# Patient Record
Sex: Male | Born: 2000 | Race: Asian | Hispanic: No | Marital: Single | Smoking: Never smoker
Health system: Southern US, Community
[De-identification: ages and names within clinical notes are randomized; demographics above are authoritative.]

## PROBLEM LIST (undated history)

## (undated) DIAGNOSIS — L309 Dermatitis, unspecified: Secondary | ICD-10-CM

## (undated) DIAGNOSIS — L509 Urticaria, unspecified: Secondary | ICD-10-CM

## (undated) DIAGNOSIS — F419 Anxiety disorder, unspecified: Secondary | ICD-10-CM

## (undated) DIAGNOSIS — F5082 Avoidant/restrictive food intake disorder: Secondary | ICD-10-CM

## (undated) HISTORY — DX: Urticaria, unspecified: L50.9

---
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
## 2021-01-11 ENCOUNTER — Ambulatory Visit: Payer: 59 | Admitting: Adult Health

## 2021-02-22 ENCOUNTER — Other Ambulatory Visit: Payer: Self-pay | Admitting: Psychiatry

## 2021-02-22 DIAGNOSIS — F5082 Avoidant/restrictive food intake disorder: Secondary | ICD-10-CM

## 2021-02-22 DIAGNOSIS — F410 Panic disorder [episodic paroxysmal anxiety] without agoraphobia: Secondary | ICD-10-CM

## 2021-02-22 DIAGNOSIS — F411 Generalized anxiety disorder: Secondary | ICD-10-CM

## 2021-02-25 ENCOUNTER — Telehealth: Payer: Self-pay | Admitting: Adult Health

## 2021-02-25 ENCOUNTER — Other Ambulatory Visit: Payer: Self-pay | Admitting: Adult Health

## 2021-02-25 DIAGNOSIS — F411 Generalized anxiety disorder: Secondary | ICD-10-CM

## 2021-02-25 DIAGNOSIS — F5082 Avoidant/restrictive food intake disorder: Secondary | ICD-10-CM

## 2021-02-25 DIAGNOSIS — F410 Panic disorder [episodic paroxysmal anxiety] without agoraphobia: Secondary | ICD-10-CM

## 2021-02-25 MED ORDER — MIRTAZAPINE 15 MG PO TBDP: ORAL_TABLET | ORAL | 1 refills | Status: DC

## 2021-02-25 NOTE — Telephone Encounter (Signed)
Next visit is 04/28/21. Requesting refill on Mirtazapine tablets called to [facility], [phone] and phone number is [phone].

## 2021-02-25 NOTE — Telephone Encounter (Signed)
Script sent  

## 2021-02-25 NOTE — Telephone Encounter (Signed)
Please schedule with new provider  Thanks  Lorris Carducci

## 2021-02-25 NOTE — Telephone Encounter (Signed)
LM to call for appt for continue care/medication refills

## 2021-04-28 ENCOUNTER — Ambulatory Visit: Payer: 59 | Admitting: Adult Health

## 2021-05-07 ENCOUNTER — Encounter: Payer: Self-pay | Admitting: Behavioral Health

## 2021-05-07 ENCOUNTER — Other Ambulatory Visit: Payer: Self-pay

## 2021-05-07 ENCOUNTER — Ambulatory Visit (INDEPENDENT_AMBULATORY_CARE_PROVIDER_SITE_OTHER): Payer: 59 | Admitting: Behavioral Health

## 2021-05-07 VITALS — Wt 109.2 lb

## 2021-05-07 DIAGNOSIS — T7840XD Allergy, unspecified, subsequent encounter: Secondary | ICD-10-CM

## 2021-05-07 DIAGNOSIS — F410 Panic disorder [episodic paroxysmal anxiety] without agoraphobia: Secondary | ICD-10-CM | POA: Diagnosis not present

## 2021-05-07 DIAGNOSIS — F5082 Avoidant/restrictive food intake disorder: Secondary | ICD-10-CM

## 2021-05-07 DIAGNOSIS — F411 Generalized anxiety disorder: Secondary | ICD-10-CM

## 2021-05-07 MED ORDER — MIRTAZAPINE 15 MG PO TBDP: ORAL_TABLET | ORAL | 1 refills | Status: DC

## 2021-05-07 MED ORDER — EPINEPHRINE 0.3 MG/0.3ML IJ SOAJ: INTRAMUSCULAR | 0 refills | Status: AC

## 2021-05-07 NOTE — Progress Notes (Signed)
[facility]  Patient ID: Joshua Huffman,  MRN: [id number]  PCP: Maeola Harman, MD  Date of Evaluation: 05/07/2021 Time spent:30 minutes  Chief Complaint:   HISTORY/CURRENT STATUS: HPI  20 year old Asian male presents to this office for medication f/u. He is previous patient of Dr. Marlyne Beards and also [facility]. He says that he is home for college and basically following up for refill on his Remeron. Says that he has noticed some weight gain but not significant. He reports anxiety at 1 and depression at 0 today. Reports sleeping 10 hours per night. He continues with therapy. No mania, SI or HI.   Individual Medical History/ Review of Systems: Changes? :No   Allergies: Almond (diagnostic)  Current Medications:  Current Outpatient Medications:  .  clonazePAM (KLONOPIN) 0.25 MG disintegrating tablet, Take 1 tablet (0.25 mg total) by mouth 2 (two) times daily as needed (anxiety)., Disp: 60 tablet, Rfl: 1 .  EPINEPHrine 0.3 mg/0.3 mL IJ SOAJ injection, INJ UTD, Disp: 1 each, Rfl: 0 .  mirtazapine (REMERON SOL-TAB) 15 MG disintegrating tablet, DISSOLVE 1 TABLET ON THE TONGUE DAILY AT BEDTIME, Disp: 30 tablet, Rfl: 1 .  hydrocortisone 2.5 % cream, Apply topically to affected area(s) twice daily as needed. (Patient not taking: Reported on 05/07/2021), Disp: , Rfl:  .  omeprazole (PRILOSEC) 20 MG capsule, Take 1 capsule (20 mg total) by mouth 2 (two) times daily before a meal. May open capsule and sprinkle beads in applesauce. (Patient not taking: Reported on 05/07/2021), Disp: 60 capsule, Rfl: 1 .  triamcinolone ointment (KENALOG) 0.1 %, Apply topically. (Patient not taking: Reported on 05/07/2021), Disp: , Rfl:    Medication Side Effects: none  Family Medical/ Social History: Changes? no MENTAL HEALTH EXAM:  Weight 109 lb 3.2 oz (49.5 kg).Body mass index is 18.17 kg/m.  General Appearance: Casual, Neat and Well Groomed  Eye Contact:  Good  Speech:  Clear and Coherent   Volume:  Decreased  Mood:  NA  Affect:  Appropriate  Thought Process:  Coherent  Orientation:  Full (Time, Place, and Person)  Thought Content: Logical   Suicidal Thoughts:  No  Homicidal Thoughts:  No  Memory:  WNL  Judgement:  Good  Insight:  Good  Psychomotor Activity:  Normal  Concentration:  Concentration: Good  Recall:  Good  Fund of Knowledge: Good  Language: Good  Assets:  Desire for Improvement  ADL's:  Intact  Cognition: WNL  Prognosis:  Good    DIAGNOSES:    ICD-10-CM   1. Allergic reaction, subsequent encounter  T78.40XD EPINEPHrine 0.3 mg/0.3 mL IJ SOAJ injection  2. Generalized anxiety disorder  F41.1 mirtazapine (REMERON SOL-TAB) 15 MG disintegrating tablet  3. Panic disorder  F41.0 mirtazapine (REMERON SOL-TAB) 15 MG disintegrating tablet  4. 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 Will call if worsening symptoms To follow up in 3 months  To reassess Greater than 50% of face to face time with patient was spent on counseling and coordination of care. We discussed weights and current barriers with food restriction. PHQ2 was negative. Reports weight holding steady. Pt is continuing with psychotherapy. Pt requested refill on Epipen due to his being expired. 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 Has not been able to get back in with prescribing  MD for refill.  Agreed to reassess in August before leaving for school. Discussed sleeping to much at 10 hours plus per night but may be due to effects of Mirtazapine.     Joan Flores, NP

## 2021-08-06 ENCOUNTER — Ambulatory Visit: Payer: 59 | Admitting: Behavioral Health

## 2021-08-10 ENCOUNTER — Ambulatory Visit (INDEPENDENT_AMBULATORY_CARE_PROVIDER_SITE_OTHER): Payer: 59 | Admitting: Behavioral Health

## 2021-08-10 ENCOUNTER — Other Ambulatory Visit: Payer: Self-pay

## 2021-08-10 ENCOUNTER — Encounter: Payer: Self-pay | Admitting: Behavioral Health

## 2021-08-10 DIAGNOSIS — F410 Panic disorder [episodic paroxysmal anxiety] without agoraphobia: Secondary | ICD-10-CM | POA: Diagnosis not present

## 2021-08-10 DIAGNOSIS — F5082 Avoidant/restrictive food intake disorder: Secondary | ICD-10-CM

## 2021-08-10 DIAGNOSIS — F411 Generalized anxiety disorder: Secondary | ICD-10-CM | POA: Diagnosis not present

## 2021-08-10 MED ORDER — MIRTAZAPINE 15 MG PO TBDP: ORAL_TABLET | ORAL | 5 refills | Status: DC

## 2021-08-10 NOTE — Progress Notes (Signed)
[facility]  Patient ID: Joshua Huffman,  MRN: [id number]  PCP: Maeola Harman, MD  Date of Evaluation: 08/10/2021 Time spent:20 minutes  Chief Complaint:  Chief Complaint   Anxiety; Depression; Follow-up; Medication Refill     HISTORY/CURRENT STATUS: HPI 20 year old patient present to this office for follow up and medication management.  Previous patient of Dr. Marlyne Beards and [facility]. He says that he is doing great except for not being able to gain more weight. Says he is not working out like he was before, but will increase when he goes back to college in [place].  Says that he is sleeping 9-10 hours per night. He reports 0 anxiety today and 0 depression. He just wants to continue Mirtazapine for appetite only. He does not need any other medication changes at this time. No mania, no psychosis. No SI/HI.   No prior medication trials noted.   Individual Medical History/ Review of Systems: Changes? :No   Allergies: Almond (diagnostic)  Current Medications:  Current Outpatient Medications:    clonazePAM (KLONOPIN) 0.25 MG disintegrating tablet, Take 1 tablet (0.25 mg total) by mouth 2 (two) times daily as needed (anxiety)., Disp: 60 tablet, Rfl: 1   EPINEPHrine 0.3 mg/0.3 mL IJ SOAJ injection, INJ UTD, Disp: 1 each, Rfl: 0   mirtazapine (REMERON SOL-TAB) 15 MG disintegrating tablet, DISSOLVE 1 TABLET ON THE TONGUE DAILY AT BEDTIME, Disp: 30 tablet, Rfl: 5 Medication Side Effects: none  Family Medical/ Social History: Changes? No  MENTAL HEALTH EXAM:  There were no vitals taken for this visit.There is no height or weight on file to calculate BMI.  General Appearance: Casual, Neat, and Well Groomed  Eye Contact:  Good  Speech:  Clear and Coherent  Volume:  Normal  Mood:  NA  Affect:  Appropriate  Thought Process:  Coherent  Orientation:  Full (Time, Place, and Person)  Thought Content: Logical   Suicidal Thoughts:  No  Homicidal Thoughts:  No  Memory:  WNL   Judgement:  Good  Insight:  Good  Psychomotor Activity:  Normal  Concentration:  Concentration: Good  Recall:  Good  Fund of Knowledge: Good  Language: Good  Assets:  Desire for Improvement  ADL's:  Intact  Cognition: WNL  Prognosis:  Good    DIAGNOSES:    ICD-10-CM   1. Generalized anxiety disorder  F41.1 mirtazapine (REMERON SOL-TAB) 15 MG disintegrating tablet    2. Panic disorder  F41.0 mirtazapine (REMERON SOL-TAB) 15 MG disintegrating tablet    3. 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 Will call if worsening symptoms Provided emergency contact information  To follow up in next June when school is out per patient. Greater than 50% of 30 min face to face time with patient was spent on counseling and coordination of care. We discussed weights and current barriers with food restriction. Discussed increasing nutritional intake with protein shakes. PHQ2 was negative. Reports weight holding steady. Pt is continuing with psychotherapy. He is leaving for school located in [place] end of month.   Joan Flores, NP

## 2022-06-10 ENCOUNTER — Ambulatory Visit: Payer: 59 | Admitting: Behavioral Health

## 2022-09-09 ENCOUNTER — Other Ambulatory Visit: Payer: Self-pay | Admitting: Behavioral Health

## 2022-09-09 DIAGNOSIS — F411 Generalized anxiety disorder: Secondary | ICD-10-CM

## 2022-09-09 DIAGNOSIS — F5082 Avoidant/restrictive food intake disorder: Secondary | ICD-10-CM

## 2022-09-09 DIAGNOSIS — F410 Panic disorder [episodic paroxysmal anxiety] without agoraphobia: Secondary | ICD-10-CM

## 2022-09-12 NOTE — Telephone Encounter (Signed)
It's been over a year since you saw him. 07/2021. Is he still your patient?

## 2023-06-23 ENCOUNTER — Other Ambulatory Visit: Payer: Self-pay | Admitting: Behavioral Health

## 2023-06-23 DIAGNOSIS — F410 Panic disorder [episodic paroxysmal anxiety] without agoraphobia: Secondary | ICD-10-CM

## 2023-06-23 DIAGNOSIS — F411 Generalized anxiety disorder: Secondary | ICD-10-CM

## 2023-06-23 DIAGNOSIS — F5082 Avoidant/restrictive food intake disorder: Secondary | ICD-10-CM

## 2023-12-15 ENCOUNTER — Other Ambulatory Visit: Payer: Self-pay

## 2023-12-15 ENCOUNTER — Encounter (HOSPITAL_COMMUNITY): Payer: Self-pay | Admitting: Emergency Medicine

## 2023-12-15 ENCOUNTER — Emergency Department (HOSPITAL_COMMUNITY)
Admission: EM | Admit: 2023-12-15 | Discharge: 2023-12-15 | Discharge status: Against Medical Advice | Payer: BC Managed Care – PPO | Attending: Emergency Medicine | Admitting: Emergency Medicine

## 2023-12-15 DIAGNOSIS — R0981 Nasal congestion: Secondary | ICD-10-CM | POA: Diagnosis not present

## 2023-12-15 DIAGNOSIS — Z1152 Encounter for screening for COVID-19: Secondary | ICD-10-CM | POA: Diagnosis not present

## 2023-12-15 DIAGNOSIS — R110 Nausea: Secondary | ICD-10-CM | POA: Diagnosis not present

## 2023-12-15 DIAGNOSIS — Z5321 Procedure and treatment not carried out due to patient leaving prior to being seen by health care provider: Secondary | ICD-10-CM | POA: Diagnosis not present

## 2023-12-15 DIAGNOSIS — M791 Myalgia, unspecified site: Secondary | ICD-10-CM | POA: Diagnosis present

## 2023-12-15 LAB — RESP PANEL BY RT-PCR (RSV, FLU A&B, COVID)  RVPGX2
Influenza A by PCR: NEGATIVE
Influenza B by PCR: NEGATIVE
Resp Syncytial Virus by PCR: NEGATIVE
SARS Coronavirus 2 by RT PCR: POSITIVE — AB

## 2023-12-15 NOTE — ED Notes (Signed)
Pt left the lobby at 0655.

## 2023-12-15 NOTE — ED Triage Notes (Signed)
Pt presents with generalized body aches, congestion, and some nausea x 1 day. Just got back from [place] on a trip.  No fever or chills.  Did feel dizzy once tonight while making toast.
